# Patient Record
Sex: Female | Born: 1960 | Race: White | Hispanic: No | Marital: Married | Smoking: Never smoker
Health system: Southern US, Community
[De-identification: ages and names within clinical notes are randomized; demographics above are authoritative.]

## PROBLEM LIST (undated history)

## (undated) DIAGNOSIS — T7840XA Allergy, unspecified, initial encounter: Secondary | ICD-10-CM

## (undated) HISTORY — PX: CHALAZION EXCISION: SHX213

## (undated) HISTORY — DX: Allergy, unspecified, initial encounter: T78.40XA

## (undated) HISTORY — PX: WISDOM TOOTH EXTRACTION: SHX21

---

## 1999-02-23 ENCOUNTER — Encounter: Payer: Self-pay | Admitting: Family Medicine

## 1999-02-23 ENCOUNTER — Encounter: Admission: RE | Admit: 1999-02-23 | Discharge: 1999-02-23 | Payer: Self-pay | Admitting: Family Medicine

## 2000-02-27 ENCOUNTER — Encounter: Payer: Self-pay | Admitting: Family Medicine

## 2000-02-27 ENCOUNTER — Encounter: Admission: RE | Admit: 2000-02-27 | Discharge: 2000-02-27 | Payer: Self-pay | Admitting: Family Medicine

## 2001-02-27 ENCOUNTER — Encounter: Admission: RE | Admit: 2001-02-27 | Discharge: 2001-02-27 | Payer: Self-pay | Admitting: Family Medicine

## 2001-02-27 ENCOUNTER — Encounter: Payer: Self-pay | Admitting: Family Medicine

## 2002-03-12 ENCOUNTER — Encounter: Payer: Self-pay | Admitting: Family Medicine

## 2002-03-12 ENCOUNTER — Encounter: Admission: RE | Admit: 2002-03-12 | Discharge: 2002-03-12 | Payer: Self-pay | Admitting: Family Medicine

## 2003-03-31 ENCOUNTER — Encounter: Admission: RE | Admit: 2003-03-31 | Discharge: 2003-03-31 | Payer: Self-pay | Admitting: Family Medicine

## 2004-05-19 ENCOUNTER — Encounter: Admission: RE | Admit: 2004-05-19 | Discharge: 2004-05-19 | Payer: Self-pay | Admitting: Family Medicine

## 2005-05-22 ENCOUNTER — Encounter: Admission: RE | Admit: 2005-05-22 | Discharge: 2005-05-22 | Payer: Self-pay | Admitting: Family Medicine

## 2005-06-01 ENCOUNTER — Encounter: Admission: RE | Admit: 2005-06-01 | Discharge: 2005-06-01 | Payer: Self-pay | Admitting: Family Medicine

## 2006-05-24 ENCOUNTER — Encounter: Admission: RE | Admit: 2006-05-24 | Discharge: 2006-05-24 | Payer: Self-pay | Admitting: Family Medicine

## 2006-12-05 ENCOUNTER — Encounter: Admission: RE | Admit: 2006-12-05 | Discharge: 2006-12-05 | Payer: Self-pay | Admitting: Family Medicine

## 2007-05-26 ENCOUNTER — Encounter: Admission: RE | Admit: 2007-05-26 | Discharge: 2007-05-26 | Payer: Self-pay | Admitting: Family Medicine

## 2008-05-26 ENCOUNTER — Encounter: Admission: RE | Admit: 2008-05-26 | Discharge: 2008-05-26 | Payer: Self-pay | Admitting: Family Medicine

## 2009-05-27 ENCOUNTER — Encounter: Admission: RE | Admit: 2009-05-27 | Discharge: 2009-05-27 | Payer: Self-pay | Admitting: Family Medicine

## 2010-05-29 ENCOUNTER — Encounter
Admission: RE | Admit: 2010-05-29 | Discharge: 2010-05-29 | Payer: Self-pay | Source: Home / Self Care | Attending: Family Medicine | Admitting: Family Medicine

## 2011-05-04 ENCOUNTER — Other Ambulatory Visit: Payer: Self-pay | Admitting: Family Medicine

## 2011-05-04 DIAGNOSIS — Z1231 Encounter for screening mammogram for malignant neoplasm of breast: Secondary | ICD-10-CM

## 2011-06-01 ENCOUNTER — Ambulatory Visit
Admission: RE | Admit: 2011-06-01 | Discharge: 2011-06-01 | Disposition: A | Discharge status: Home / Self Care | Payer: 59 | Source: Ambulatory Visit | Attending: Family Medicine | Admitting: Family Medicine

## 2011-06-01 DIAGNOSIS — Z1231 Encounter for screening mammogram for malignant neoplasm of breast: Secondary | ICD-10-CM

## 2012-05-14 ENCOUNTER — Other Ambulatory Visit: Payer: Self-pay | Admitting: Family Medicine

## 2012-05-14 DIAGNOSIS — Z1231 Encounter for screening mammogram for malignant neoplasm of breast: Secondary | ICD-10-CM

## 2012-06-02 ENCOUNTER — Ambulatory Visit
Admission: RE | Admit: 2012-06-02 | Discharge: 2012-06-02 | Disposition: A | Discharge status: Home / Self Care | Payer: 59 | Source: Ambulatory Visit | Attending: Family Medicine | Admitting: Family Medicine

## 2012-06-02 DIAGNOSIS — Z1231 Encounter for screening mammogram for malignant neoplasm of breast: Secondary | ICD-10-CM

## 2013-03-14 ENCOUNTER — Ambulatory Visit (INDEPENDENT_AMBULATORY_CARE_PROVIDER_SITE_OTHER): Payer: 59 | Admitting: Family Medicine

## 2013-03-14 VITALS — BP 100/62 | HR 84 | Temp 98.1°F | Resp 16 | Ht 67.25 in | Wt 161.4 lb

## 2013-03-14 DIAGNOSIS — J069 Acute upper respiratory infection, unspecified: Secondary | ICD-10-CM

## 2013-03-14 DIAGNOSIS — G43909 Migraine, unspecified, not intractable, without status migrainosus: Secondary | ICD-10-CM

## 2013-03-14 NOTE — Progress Notes (Signed)
Subjective

## 2013-03-14 NOTE — Patient Instructions (Signed)
Continue using over-the-counter medications for symptomatic relief. Return if worse or call back if specific issues.

## 2013-05-07 ENCOUNTER — Other Ambulatory Visit: Payer: Self-pay

## 2013-05-07 DIAGNOSIS — Z1231 Encounter for screening mammogram for malignant neoplasm of breast: Secondary | ICD-10-CM

## 2013-06-08 ENCOUNTER — Ambulatory Visit
Admission: RE | Admit: 2013-06-08 | Discharge: 2013-06-08 | Disposition: A | Discharge status: Home / Self Care | Payer: 59 | Source: Ambulatory Visit

## 2013-06-08 DIAGNOSIS — Z1231 Encounter for screening mammogram for malignant neoplasm of breast: Secondary | ICD-10-CM

## 2014-05-03 ENCOUNTER — Other Ambulatory Visit: Payer: Self-pay

## 2014-05-03 ENCOUNTER — Other Ambulatory Visit: Payer: Self-pay | Admitting: Family Medicine

## 2014-05-03 DIAGNOSIS — Z1231 Encounter for screening mammogram for malignant neoplasm of breast: Secondary | ICD-10-CM

## 2014-06-14 ENCOUNTER — Ambulatory Visit: Payer: 59

## 2014-06-21 ENCOUNTER — Ambulatory Visit
Admission: RE | Admit: 2014-06-21 | Discharge: 2014-06-21 | Disposition: A | Discharge status: Home / Self Care | Payer: BLUE CROSS/BLUE SHIELD | Source: Ambulatory Visit

## 2014-06-21 DIAGNOSIS — Z1231 Encounter for screening mammogram for malignant neoplasm of breast: Secondary | ICD-10-CM

## 2014-06-22 ENCOUNTER — Other Ambulatory Visit: Payer: Self-pay | Admitting: Family Medicine

## 2014-06-22 DIAGNOSIS — R928 Other abnormal and inconclusive findings on diagnostic imaging of breast: Secondary | ICD-10-CM

## 2014-06-29 ENCOUNTER — Ambulatory Visit
Admission: RE | Admit: 2014-06-29 | Discharge: 2014-06-29 | Disposition: A | Discharge status: Home / Self Care | Payer: BLUE CROSS/BLUE SHIELD | Source: Ambulatory Visit | Attending: Family Medicine | Admitting: Family Medicine

## 2014-06-29 DIAGNOSIS — R928 Other abnormal and inconclusive findings on diagnostic imaging of breast: Secondary | ICD-10-CM

## 2015-05-25 ENCOUNTER — Other Ambulatory Visit: Payer: Self-pay

## 2015-05-25 DIAGNOSIS — Z1231 Encounter for screening mammogram for malignant neoplasm of breast: Secondary | ICD-10-CM

## 2015-06-23 ENCOUNTER — Ambulatory Visit
Admission: RE | Admit: 2015-06-23 | Discharge: 2015-06-23 | Disposition: A | Discharge status: Home / Self Care | Payer: Managed Care, Other (non HMO) | Source: Ambulatory Visit

## 2015-06-23 DIAGNOSIS — Z1231 Encounter for screening mammogram for malignant neoplasm of breast: Secondary | ICD-10-CM

## 2015-06-27 ENCOUNTER — Other Ambulatory Visit: Payer: Self-pay | Admitting: Family Medicine

## 2015-06-27 DIAGNOSIS — R928 Other abnormal and inconclusive findings on diagnostic imaging of breast: Secondary | ICD-10-CM

## 2015-07-04 ENCOUNTER — Ambulatory Visit
Admission: RE | Admit: 2015-07-04 | Discharge: 2015-07-04 | Disposition: A | Discharge status: Home / Self Care | Payer: Managed Care, Other (non HMO) | Source: Ambulatory Visit | Attending: Family Medicine | Admitting: Family Medicine

## 2015-07-04 DIAGNOSIS — R928 Other abnormal and inconclusive findings on diagnostic imaging of breast: Secondary | ICD-10-CM

## 2016-03-21 ENCOUNTER — Other Ambulatory Visit: Payer: Self-pay | Admitting: Family Medicine

## 2016-03-21 DIAGNOSIS — Z1231 Encounter for screening mammogram for malignant neoplasm of breast: Secondary | ICD-10-CM

## 2016-06-25 ENCOUNTER — Ambulatory Visit
Admission: RE | Admit: 2016-06-25 | Discharge: 2016-06-25 | Disposition: A | Discharge status: Home / Self Care | Payer: Managed Care, Other (non HMO) | Source: Ambulatory Visit | Attending: Family Medicine | Admitting: Family Medicine

## 2016-06-25 DIAGNOSIS — Z1231 Encounter for screening mammogram for malignant neoplasm of breast: Secondary | ICD-10-CM

## 2017-05-30 ENCOUNTER — Other Ambulatory Visit: Payer: Self-pay | Admitting: Family Medicine

## 2017-05-30 DIAGNOSIS — Z1231 Encounter for screening mammogram for malignant neoplasm of breast: Secondary | ICD-10-CM

## 2017-06-27 ENCOUNTER — Ambulatory Visit
Admission: RE | Admit: 2017-06-27 | Discharge: 2017-06-27 | Disposition: A | Discharge status: Home / Self Care | Payer: Managed Care, Other (non HMO) | Source: Ambulatory Visit | Attending: Family Medicine | Admitting: Family Medicine

## 2017-06-27 DIAGNOSIS — Z1231 Encounter for screening mammogram for malignant neoplasm of breast: Secondary | ICD-10-CM

## 2018-05-27 ENCOUNTER — Other Ambulatory Visit: Payer: Self-pay | Admitting: Family Medicine

## 2018-05-27 DIAGNOSIS — Z1231 Encounter for screening mammogram for malignant neoplasm of breast: Secondary | ICD-10-CM

## 2018-07-01 ENCOUNTER — Ambulatory Visit
Admission: RE | Admit: 2018-07-01 | Discharge: 2018-07-01 | Disposition: A | Discharge status: Home / Self Care | Payer: BLUE CROSS/BLUE SHIELD | Source: Ambulatory Visit | Attending: Family Medicine | Admitting: Family Medicine

## 2018-07-01 DIAGNOSIS — Z1231 Encounter for screening mammogram for malignant neoplasm of breast: Secondary | ICD-10-CM

## 2018-07-02 ENCOUNTER — Other Ambulatory Visit: Payer: Self-pay | Admitting: Family Medicine

## 2018-07-02 DIAGNOSIS — R928 Other abnormal and inconclusive findings on diagnostic imaging of breast: Secondary | ICD-10-CM

## 2018-07-04 ENCOUNTER — Ambulatory Visit
Admission: RE | Admit: 2018-07-04 | Discharge: 2018-07-04 | Disposition: A | Discharge status: Home / Self Care | Payer: BLUE CROSS/BLUE SHIELD | Source: Ambulatory Visit | Attending: Family Medicine | Admitting: Family Medicine

## 2018-07-04 ENCOUNTER — Ambulatory Visit: Payer: BLUE CROSS/BLUE SHIELD

## 2018-07-04 DIAGNOSIS — R928 Other abnormal and inconclusive findings on diagnostic imaging of breast: Secondary | ICD-10-CM

## 2020-08-17 IMAGING — MG DIGITAL DIAGNOSTIC UNILATERAL LEFT MAMMOGRAM WITH TOMO AND CAD
8 series · 9 of 24 positions shown · non-contrast
Comparison: Previous exam(s).

CLINICAL DATA: Patient was called back from screening mammogram for
possible distortion in the left breast.

EXAM:
DIGITAL DIAGNOSTIC UNILATERAL LEFT MAMMOGRAM WITH CAD AND TOMO

[L CC synth-2D (1 of 2)]
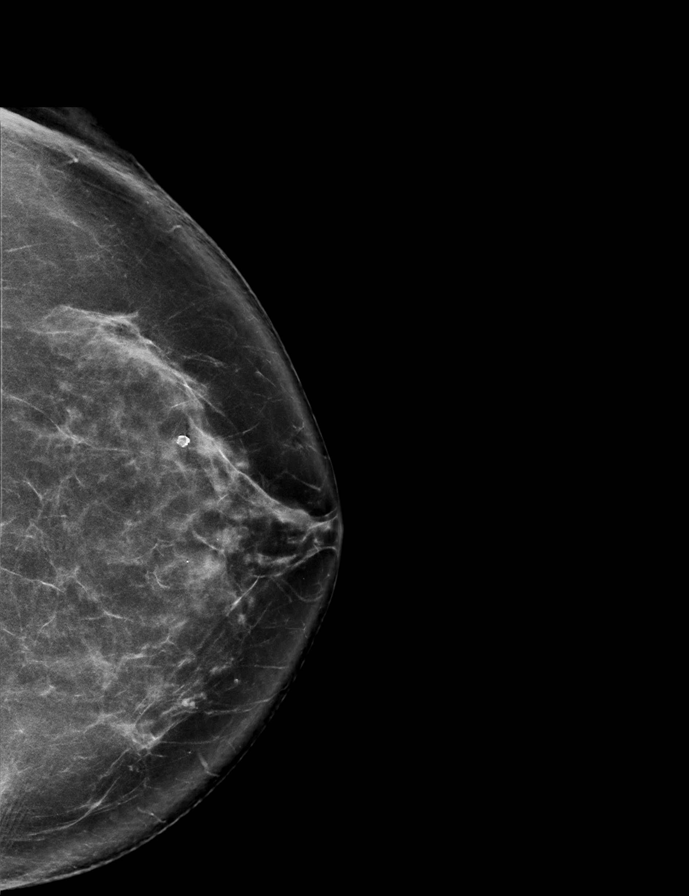

[L MLO synth-2D]
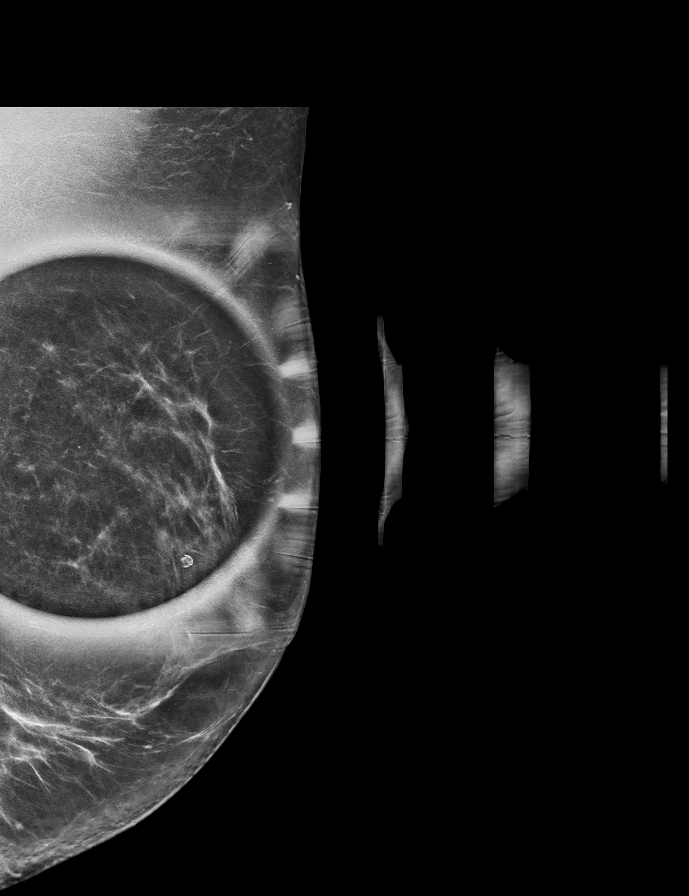

[L CC synth-2D (2 of 2)]
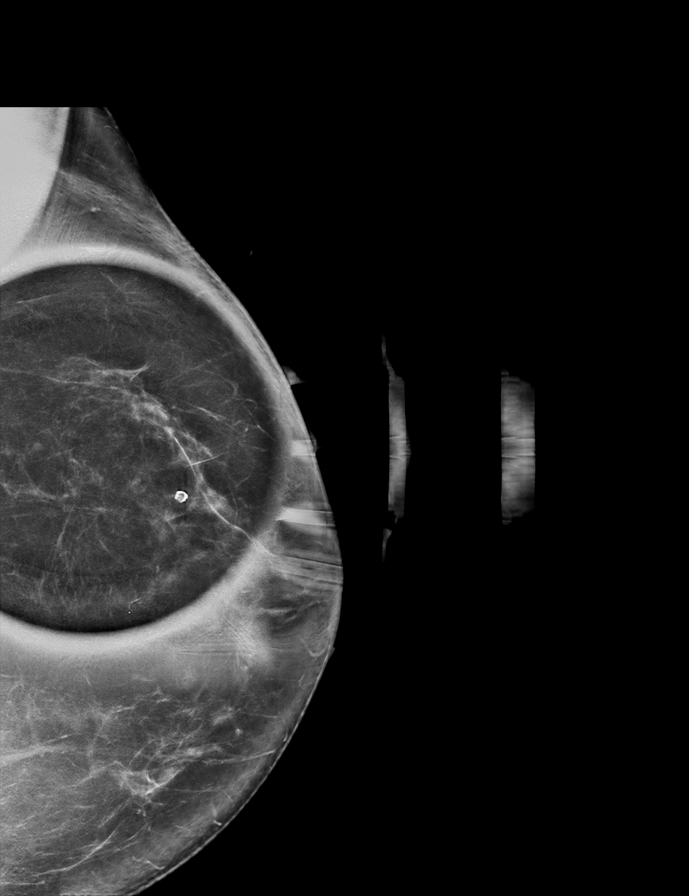

[L ML synth-2D]
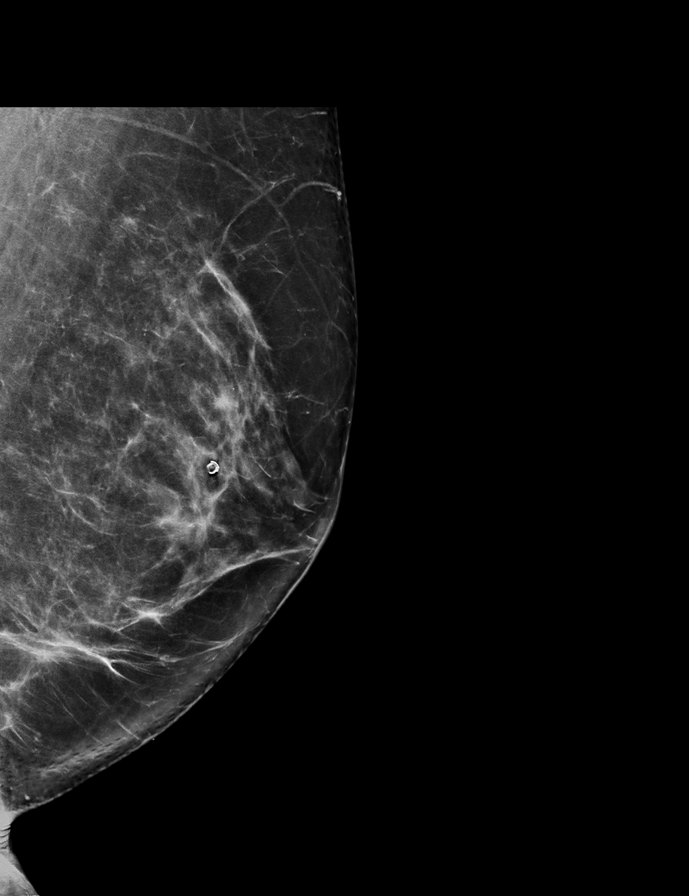

[L ML tomo · 2 of 79 frames shown]
[frame 26/79]
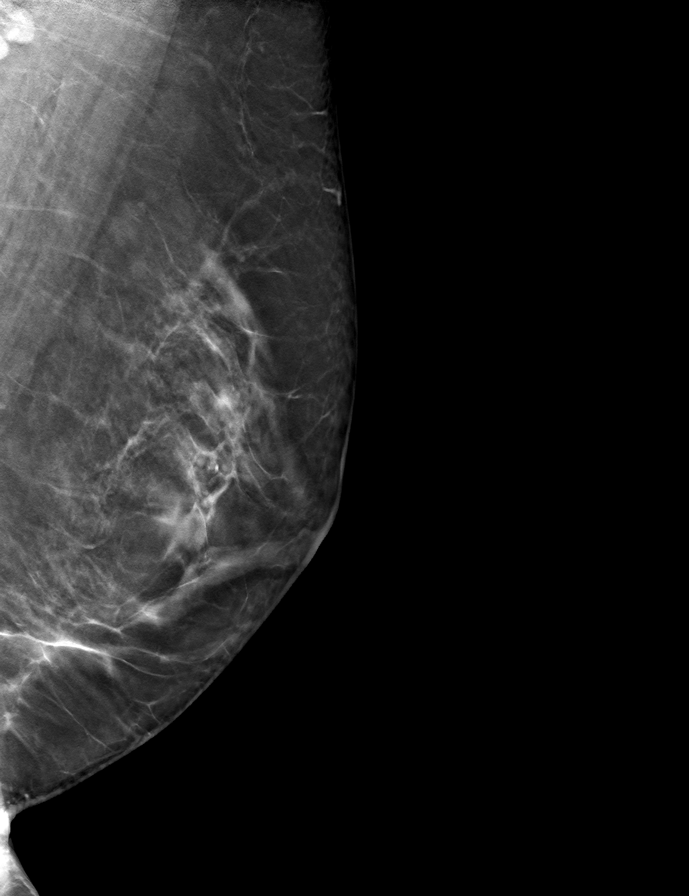
[frame 40/79]
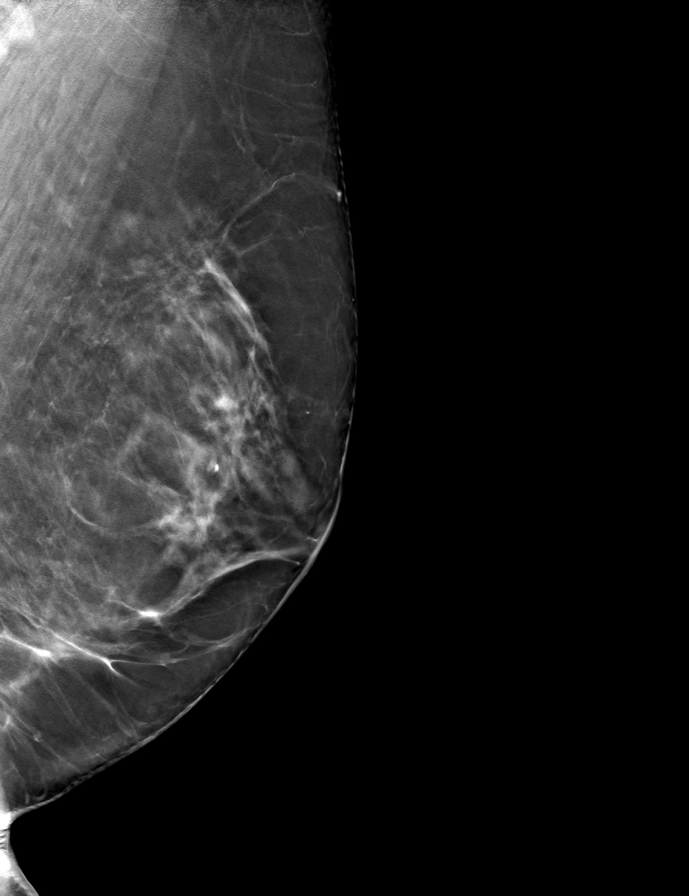

[L CC tomo (1 of 2) · tomo slice 47/92.0]
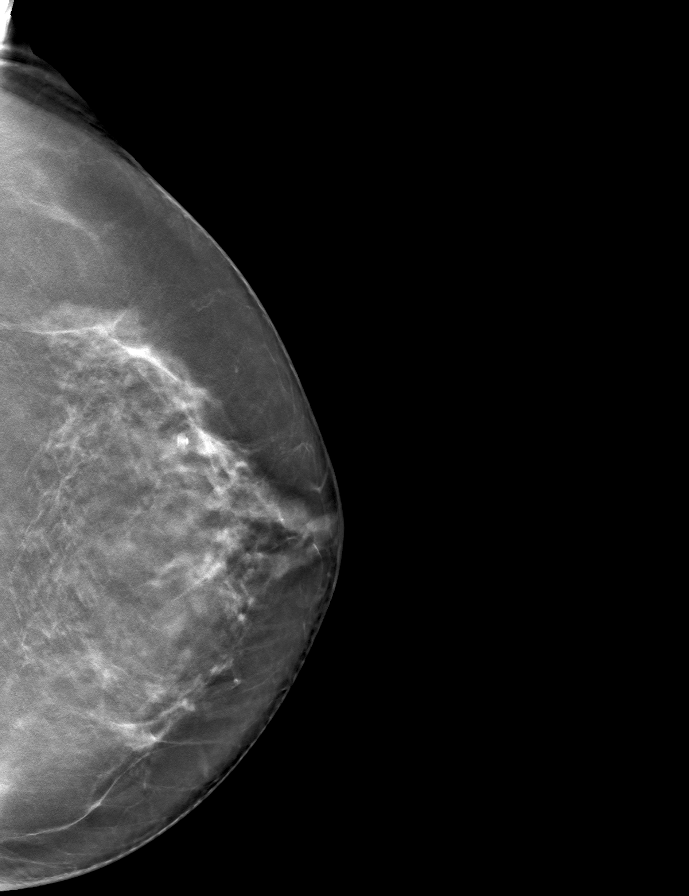

[L CC tomo (2 of 2) · tomo slice 45/89.0]
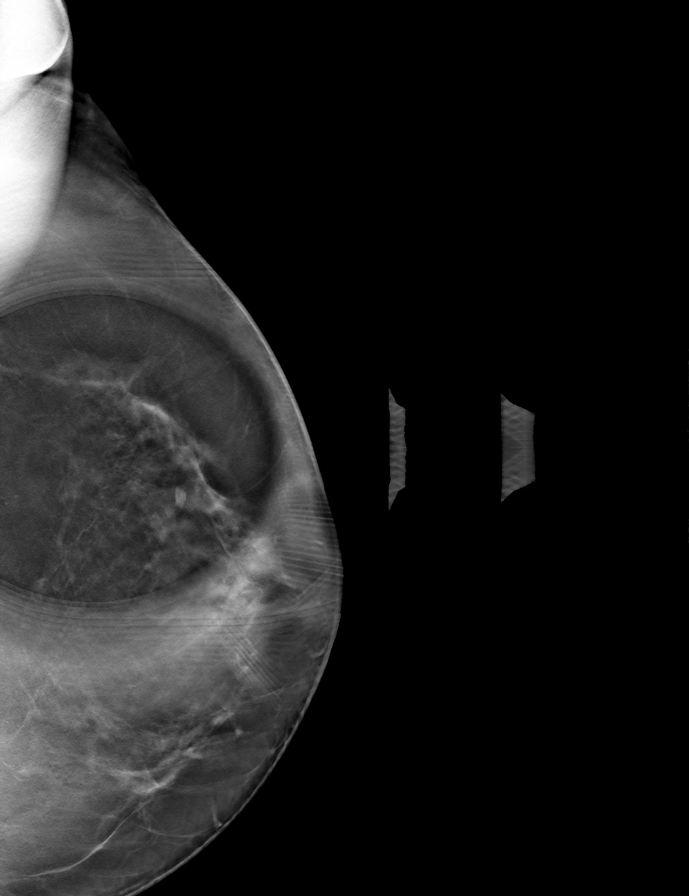

[L MLO tomo · tomo slice 39/77.0]
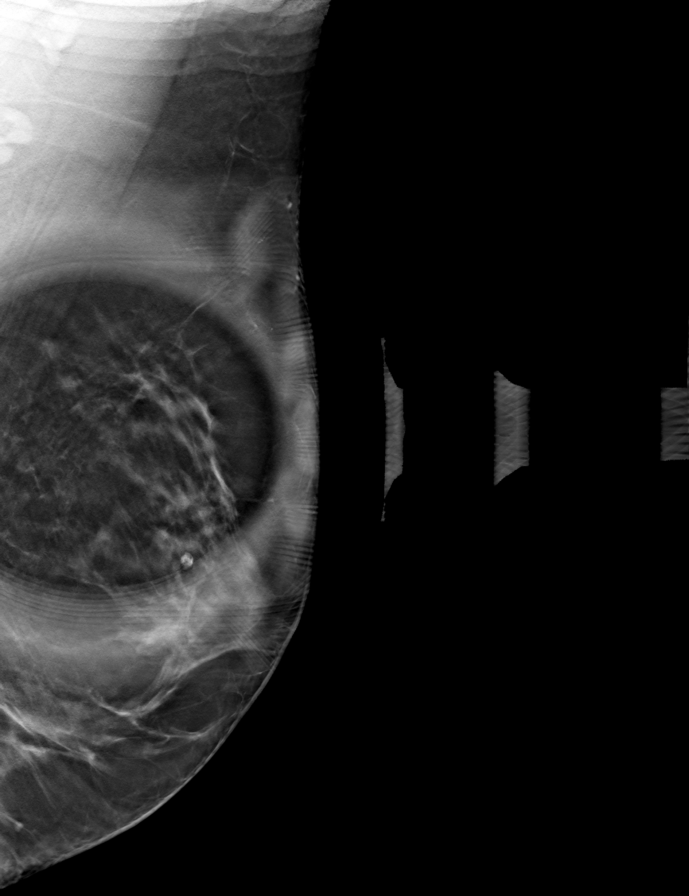

[9 of 24 positions shown; findings below may reference images not displayed]

ACR Breast Density Category b: There are scattered areas of
fibroglandular density.
FINDINGS: Additional imaging of the left breast was performed. No distortion,
mass or malignant type microcalcifications identified.

Mammographic images were processed with CAD.
IMPRESSION: No evidence of malignancy in the left breast.

RECOMMENDATION:
Bilateral screening mammogram in 1 year is recommended.

I have discussed the findings and recommendations with the patient.
Results were also provided in writing at the conclusion of the
visit. If applicable, a reminder letter will be sent to the patient
regarding the next appointment.

BI-RADS CATEGORY  1: Negative.

## 2021-01-03 ENCOUNTER — Ambulatory Visit: Payer: Self-pay | Admitting: Allergy

## 2021-01-12 ENCOUNTER — Ambulatory Visit: Payer: BC Managed Care – PPO | Admitting: Allergy and Immunology

## 2021-01-12 ENCOUNTER — Encounter: Payer: Self-pay | Admitting: Allergy and Immunology

## 2021-01-12 ENCOUNTER — Other Ambulatory Visit: Payer: Self-pay

## 2021-01-12 VITALS — BP 118/76 | HR 74 | Resp 16 | Ht 67.0 in | Wt 186.8 lb

## 2021-01-12 DIAGNOSIS — L255 Unspecified contact dermatitis due to plants, except food: Secondary | ICD-10-CM

## 2021-01-12 DIAGNOSIS — J3089 Other allergic rhinitis: Secondary | ICD-10-CM

## 2021-01-12 MED ORDER — HALOBETASOL PROPIONATE 0.05 % EX OINT: TOPICAL_OINTMENT | Freq: Two times a day (BID) | CUTANEOUS | 5 refills | Status: DC

## 2021-01-12 MED ORDER — MONTELUKAST SODIUM 10 MG PO TABS: 10.0000 mg | ORAL_TABLET | Freq: Every day | ORAL | 5 refills | Status: DC

## 2021-01-12 MED ORDER — DESONIDE 0.05 % EX OINT: TOPICAL_OINTMENT | CUTANEOUS | 5 refills | Status: DC

## 2021-01-12 NOTE — Patient Instructions (Addendum)
  1.  Allergen avoidance measures?  2.  Treat and prevent inflammation of airway:  A. Montelukast 10 mg - 1 tablet 1 time per day B. OTC Flonase Sensimist - 1-2 sprays each nostril 3-7 times per week  3. If needed:  A. Ultravate ointment to bug bite and plant dermatitis 2 times a day B. Desonide ointment to eye lids 3-7 times per week C. Zyrtec 10 mg - 1 tablet 1 time per day D. Pataday - 1 drop each eye 1 time per day  4. Obtain fall flu vaccine  5. Return to clinic in 6 months or earlier if problem

## 2021-01-12 NOTE — Progress Notes (Signed)
[place] - [place] - [place] - [place] - [place]   NEW PATIENT NOTE  Referring Provider: No ref. provider found Primary Provider: Olive Bass, MD Date of office visit: 01/12/2021    Subjective:   Chief Complaint:  Erica Crawford (DOB: [birth year]) is a 60 y.o. female who presents to the clinic on 01/12/2021 with a chief complaint of Allergic Rhinitis  and Rash .     HPI: Erica Crawford presents to this clinic in evaluation of several issues.  First, she has "lifelong seasonal allergies".  This appears to be more of an issue in the fall than the spring and involves her nose and eyes.  She will have some nasal congestion and sneezing and itchy watery eyes.  Occasionally she develops a "stye" of her eye.  Second, she has had some dermatitis.  This is involved her torso and her arms and she was diagnosed with poison ivy and given Kenalog and a topical agent to use.  She has had a similar type of rash in the past and was always told that she had poison ivy.  When she takes oral steroids it sometimes develops and hand swelling.  In addition, she sometimes gets very large local reactions to mosquito bites and other insect stings.  Third, she gets eyelid flaking especially in the spring more so than the fall.  Fourth, since she is discontinue her Zyrtec she has itchy.  She now finds it when she does not take Zyrtec she gets itchy.  She has no associated systemic or constitutional symptoms.  She uses a CPAP for sleep apnea.  She does not receive a COVID-vaccine.  She has not been infected with COVID as far she knows.  Past Medical History:  Diagnosis Date   Allergy     Past Surgical History:  Procedure Laterality Date   CHALAZION EXCISION     WISDOM TOOTH EXTRACTION      Allergies as of 01/12/2021       Reactions   Prednisone Swelling   Penicillins Swelling   Sulfa Antibiotics Nausea Only        Medication List    ascorbic acid 500 MG tablet Commonly known as:  VITAMIN C Vitamin C 500 mg tablet   1 tablet every day by oral route.   aspirin 81 MG tablet Take 81 mg by mouth daily.   aspirin 81 MG EC tablet Take by mouth.   bifidobacterium infantis capsule Take 1 capsule by mouth daily.   calcium carbonate 1250 (500 Ca) MG tablet Commonly known as: OS-CAL - dosed in mg of elemental calcium Calcium 500  500 mg calcium (1,250 mg) tablet   1 tablet every day by oral route.   cetirizine 10 MG tablet Commonly known as: ZYRTEC Zyrtec 10 mg tablet   1 tablet every day by oral route.   Cholecalciferol 25 MCG (1000 UT) tablet Vitamin D3 25 mcg (1,000 unit) tablet   2 tablets every day by oral route.   desonide 0.05 % ointment Commonly known as: DESOWEN Apply to eye lids 3-7 times per week as needed Started by: Kaylor Maiers Claudia Pollock, MD   Echinacea 400 MG Caps   Fish Oil 1000 MG Caps   guaifenesin 400 MG Tabs tablet Commonly known as: HUMIBID E guaifenesin 400 mg tablet   1 tablet every day by oral route.   halobetasol 0.05 % ointment Commonly known as: Ultravate Apply topically 2 (two) times daily. Started by: Jessica Priest, MD   montelukast 10 MG  tablet Commonly known as: SINGULAIR Take 1 tablet (10 mg total) by mouth at bedtime. Started by: Jessica Priest, MD   STOOL SOFTENER PO Take by mouth.   triamcinolone cream 0.1 % Commonly known as: KENALOG Apply topically 3 (three) times daily as needed.   Womens Multivitamin Tabs    Review of systems negative except as noted in HPI / PMHx or noted below:  Review of Systems  Constitutional: Negative.   HENT: Negative.    Eyes: Negative.   Respiratory: Negative.    Cardiovascular: Negative.   Gastrointestinal: Negative.   Genitourinary: Negative.   Musculoskeletal: Negative.   Skin: Negative.   Neurological: Negative.   Endo/Heme/Allergies: Negative.   Psychiatric/Behavioral: Negative.     Family History  Problem Relation Age of Onset   Cancer Mother    Breast cancer  Mother    Cancer Father    Asthma Brother    Allergic rhinitis Brother    Hypertension Maternal Grandmother    Stroke Maternal Grandmother    Cancer Maternal Grandfather    Heart disease Paternal Grandmother    Hypertension Paternal Grandfather     Social History   Socioeconomic History   Marital status: Married    Spouse name: Not on file   Number of children: Not on file   Years of education: Not on file   Highest education level: Not on file  Occupational History   Not on file  Tobacco Use   Smoking status: Never   Smokeless tobacco: Never  Substance and Sexual Activity   Alcohol use: Yes   Drug use: No   Sexual activity: Not on file  Other Topics Concern   Not on file  Social History Narrative   Not on file   Environmental and Social history  Lives in a house with a dry environment, no animals located inside the household, carpet in the bedroom, no plastic on the bed, no plastic on the pillow, no smoking ongoing with inside the household.  Objective:   Vitals:   01/12/21 1507  BP: 118/76  Pulse: 74  Resp: 16  SpO2: 98%   Height: 5\' 7"  (170.2 cm) Weight: 186 lb 12.8 oz (84.7 kg)  Physical Exam Constitutional:      Appearance: She is not diaphoretic.  HENT:     Head: Normocephalic.     Right Ear: Tympanic membrane, ear canal and external ear normal.     Left Ear: Tympanic membrane, ear canal and external ear normal.     Nose: Nose normal. No mucosal edema or rhinorrhea.     Mouth/Throat:     Pharynx: Uvula midline. No oropharyngeal exudate.  Eyes:     Conjunctiva/sclera: Conjunctivae normal.  Neck:     Thyroid: No thyromegaly.     Trachea: Trachea normal. No tracheal tenderness or tracheal deviation.  Cardiovascular:     Rate and Rhythm: Normal rate and regular rhythm.     Heart sounds: Normal heart sounds, S1 normal and S2 normal. No murmur heard. Pulmonary:     Effort: No respiratory distress.     Breath sounds: Normal breath sounds. No  stridor. No wheezing or rales.  Lymphadenopathy:     Head:     Right side of head: No tonsillar adenopathy.     Left side of head: No tonsillar adenopathy.     Cervical: No cervical adenopathy.  Skin:    Findings: No erythema or rash.     Nails: There is no clubbing.  Neurological:  Mental Status: She is alert.    Diagnostics: Allergy skin tests were performed.  She did not demonstrate any hypersensitivity against a screening panel of aeroallergens or foods.  Assessment and Plan:    1. Perennial allergic rhinitis   2. Contact dermatitis due to plant     1.  Allergen avoidance measures?  2.  Treat and prevent inflammation of airway:  A. Montelukast 10 mg - 1 tablet 1 time per day B. OTC Flonase Sensimist - 1-2 sprays each nostril 3-7 times per week  3. If needed:  A. Ultravate ointment to bug bite and plant dermatitis 2 times a day B. Desonide ointment to eye lids 3-7 times per week C. Zyrtec 10 mg - 1 tablet 1 time per day D. Pataday - 1 drop each eye 1 time per day  4. Obtain fall flu vaccine  5. Return to clinic in 6 months or earlier if problem  Harshitha appears to have some inflammation of her airway and we will have her use montelukast and a nasal steroid on a preventative basis aiming for the least amount of this medication that is required to control and prevent her symptoms.  She also appears to have some problems with inflammation of her skin and what sounds like plant induced contact dermatitis from the Rhus family.  She can use some occasional low potency topical steroid to her eyelids if required and she can apply high potency topical steroid when she develops a contact dermatitis.  Assuming she does well with this plan I will see her back in this clinic in 6 months or earlier if there is a problem.  Jessica Priest, MD Allergy / Immunology [facility]

## 2021-01-16 ENCOUNTER — Encounter: Payer: Self-pay | Admitting: Allergy and Immunology

## 2021-07-11 ENCOUNTER — Other Ambulatory Visit: Payer: Self-pay | Admitting: Allergy and Immunology

## 2021-07-12 ENCOUNTER — Encounter: Payer: Self-pay | Admitting: Allergy and Immunology

## 2021-07-12 ENCOUNTER — Ambulatory Visit: Payer: 59 | Admitting: Allergy and Immunology

## 2021-07-12 ENCOUNTER — Other Ambulatory Visit: Payer: Self-pay

## 2021-07-12 VITALS — BP 118/62 | HR 80 | Resp 16 | Ht 67.0 in | Wt 186.0 lb

## 2021-07-12 DIAGNOSIS — J988 Other specified respiratory disorders: Secondary | ICD-10-CM | POA: Diagnosis not present

## 2021-07-12 DIAGNOSIS — L255 Unspecified contact dermatitis due to plants, except food: Secondary | ICD-10-CM | POA: Diagnosis not present

## 2021-07-12 DIAGNOSIS — J3089 Other allergic rhinitis: Secondary | ICD-10-CM

## 2021-07-12 DIAGNOSIS — B9789 Other viral agents as the cause of diseases classified elsewhere: Secondary | ICD-10-CM

## 2021-07-12 NOTE — Progress Notes (Signed)
? ?[place] - [employer] - [place] - [place] - [place] ? ? ?Follow-up Note ? ?Referring Provider: Olive Bass, MD ?Primary Provider: Olive Bass, MD ?Date of Office Visit: 07/12/2021 ? ?Subjective:  ? ?Erica Crawford (DOB: 03-Jul-1960) is a 61 y.o. female who [facility] in re-evaluation of the following: ? ?HPI: Erica Crawford presents to this clinic in evaluation of allergic rhinitis, inflammatory dermatosis, contact dermatitis.  I last saw her in this clinic during her initial evaluation of 12 January 2021. ? ?She has really done well with her airway issues and she has not had any problems with dermatitis and overall was very happy with how she was doing while using montelukast and the nasal steroid on a pretty consistent basis. ? ?Unfortunately, about 5 days ago she had a film in her throat and a sore throat and a tickle in her throat and little bit of a cough without any significant nasal symptoms and without a fever or other respiratory tract symptoms.  She is not achy and has had no chills.  She has had a little bit of a sore throat.  This started while she was on her sixth day of a 1 week vacation in [place]. ? ?Allergies as of 07/12/2021   ? ?   Reactions  ? Penicillins Swelling  ? Sulfa Antibiotics Nausea Only  ? ?  ? ?  ?Medication List  ? ? ?ascorbic acid 500 MG tablet ?Commonly known as: VITAMIN C ?Vitamin C 500 mg tablet ?  1 tablet every day by oral route. ?  ?aspirin 81 MG EC tablet ?Take by mouth. ?  ?bifidobacterium infantis capsule ?Take 1 capsule by mouth daily. ?  ?calcium carbonate 1250 (500 Ca) MG tablet ?Commonly known as: OS-CAL - dosed in mg of elemental calcium ?Calcium 500  500 mg calcium (1,250 mg) tablet ?  1 tablet every day by oral route. ?  ?Cholecalciferol 25 MCG (1000 UT) tablet ?Vitamin D3 25 mcg (1,000 unit) tablet ?  2 tablets every day by oral route. ?  ?desonide 0.05 % ointment ?Commonly known as: DESOWEN ?Apply to eye lids  3-7 times per week as needed ?  ?Echinacea 400 MG Caps ?  ?Fish Oil 1000 MG Caps ?  ?guaifenesin 400 MG Tabs tablet ?Commonly known as: HUMIBID E ?guaifenesin 400 mg tablet ?  1 tablet every day by oral route. ?  ?halobetasol 0.05 % ointment ?Commonly known as: Ultravate ?Apply topically 2 (two) times daily. ?  ?montelukast 10 MG tablet ?Commonly known as: SINGULAIR ?TAKE 1 TABLET BY MOUTH EVERYDAY AT BEDTIME ?  ?STOOL SOFTENER PO ?Take by mouth. ?  ?Womens Multivitamin Tabs ?  ? ?Past Medical History:  ?Diagnosis Date  ? Allergy   ? ? ?Past Surgical History:  ?Procedure Laterality Date  ? CHALAZION EXCISION    ? WISDOM TOOTH EXTRACTION    ? ? ?Review of systems negative except as noted in HPI / PMHx or noted below: ? ?Review of Systems  ?Constitutional: Negative.   ?HENT: Negative.    ?Eyes: Negative.   ?Respiratory: Negative.    ?Cardiovascular: Negative.   ?Gastrointestinal: Negative.   ?Genitourinary: Negative.   ?Musculoskeletal: Negative.   ?Skin: Negative.   ?Neurological: Negative.   ?Endo/Heme/Allergies: Negative.   ?Psychiatric/Behavioral: Negative.    ? ? ?Objective:  ? ?Vitals:  ? 07/12/21 1532  ?BP: 118/62  ?Pulse: 80  ?Resp: 16  ?SpO2: 98%  ? ?Height: 5\' 7"  (170.2 cm)  ?  Weight: 186 lb (84.4 kg)  ? ?Physical Exam ?Constitutional:   ?   Appearance: She is not diaphoretic.  ?HENT:  ?   Head: Normocephalic.  ?   Right Ear: Tympanic membrane, ear canal and external ear normal.  ?   Left Ear: Tympanic membrane, ear canal and external ear normal.  ?   Nose: Nose normal. No mucosal edema or rhinorrhea.  ?   Mouth/Throat:  ?   Pharynx: Uvula midline. No oropharyngeal exudate.  ?Eyes:  ?   Conjunctiva/sclera: Conjunctivae normal.  ?Neck:  ?   Thyroid: No thyromegaly.  ?   Trachea: Trachea normal. No tracheal tenderness or tracheal deviation.  ?Cardiovascular:  ?   Rate and Rhythm: Normal rate and regular rhythm.  ?   Heart sounds: Normal heart sounds, S1 normal and S2 normal. No murmur heard. ?Pulmonary:  ?    Effort: No respiratory distress.  ?   Breath sounds: Normal breath sounds. No stridor. No wheezing or rales.  ?Lymphadenopathy:  ?   Head:  ?   Right side of head: No tonsillar adenopathy.  ?   Left side of head: No tonsillar adenopathy.  ?   Cervical: No cervical adenopathy.  ?Skin: ?   Findings: No erythema or rash.  ?   Nails: There is no clubbing.  ?Neurological:  ?   Mental Status: She is alert.  ? ? ?Diagnostics: none ? ?Assessment and Plan:  ? ?1. Perennial allergic rhinitis   ?2. Contact dermatitis due to plant   ?3. Viral respiratory illness   ? ? ?1.  Treat and prevent inflammation of airway: ? ?A. Montelukast 10 mg - 1 tablet 1 time per day ?B. OTC Flonase Sensimist - 1-2 sprays each nostril 1 time per day ? ?2. If needed: ? ?A. Ultravate ointment to bug bite and plant dermatitis 2 times a day ?B. Desonide ointment to eye lids 3-7 times per week ?C. Zyrtec 10 mg - 1 tablet 1 time per day ?D. Pataday - 1 drop each eye 1 time per day ? ?3. For this recent event: ? ?A. Prednisone 10 mg - 1 tablet 1 time per day for 7 days only ?B. Mucinex DM ? ?4. Return to clinic in 12 months or earlier if problem ? ?[name]'s history is very consistent with developing a relatively mild respiratory tract infection most likely of viral etiology and we will treat her with the therapy noted above and assume she is going to do well with this plan.  Her montelukast and Flonase has resulted in excellent control of her airway issue in general and she has not had to utilize any therapy for her dermatitis since her last visit.  Assuming she does well with this plan I will see her back in this clinic in 1 year or earlier if there is a problem. ? ?[name], MD ?Allergy / Immunology ?[facility] ?

## 2021-07-12 NOTE — Patient Instructions (Addendum)
?  1.  Treat and prevent inflammation of airway: ? ?A. Montelukast 10 mg - 1 tablet 1 time per day ?B. OTC Flonase Sensimist - 1-2 sprays each nostril 1 time per day ? ?2. If needed: ? ?A. Ultravate ointment to bug bite and plant dermatitis 2 times a day ?B. Desonide ointment to eye lids 3-7 times per week ?C. Zyrtec 10 mg - 1 tablet 1 time per day ?D. Pataday - 1 drop each eye 1 time per day ? ?3. For this recent event: ? ?A. Prednisone 10 mg - 1 tablet 1 time per day for 7 days only ?B. Mucinex DM ? ?4. Return to clinic in 12 months or earlier if problem ?

## 2021-07-13 ENCOUNTER — Encounter: Payer: Self-pay | Admitting: Allergy and Immunology

## 2022-04-04 ENCOUNTER — Other Ambulatory Visit: Payer: Self-pay | Admitting: Allergy and Immunology

## 2022-07-16 ENCOUNTER — Ambulatory Visit: Payer: 59 | Admitting: Allergy and Immunology

## 2022-07-16 ENCOUNTER — Encounter: Payer: Self-pay | Admitting: Allergy and Immunology

## 2022-07-16 VITALS — BP 92/66 | HR 68 | Resp 14 | Ht 66.0 in | Wt 192.8 lb

## 2022-07-16 DIAGNOSIS — J3089 Other allergic rhinitis: Secondary | ICD-10-CM | POA: Diagnosis not present

## 2022-07-16 DIAGNOSIS — L989 Disorder of the skin and subcutaneous tissue, unspecified: Secondary | ICD-10-CM | POA: Diagnosis not present

## 2022-07-16 MED ORDER — MONTELUKAST SODIUM 10 MG PO TABS: ORAL_TABLET | ORAL | 11 refills | Status: DC

## 2022-07-16 NOTE — Progress Notes (Unsigned)
[place] - [place] - [place]   Follow-up Note  Referring Provider: Algis Greenhouse, MD Primary Provider: Algis Greenhouse, MD Date of Office Visit: 07/16/2022  Subjective:   Erica Crawford (DOB: 1960-10-06) is a 62 y.o. female who [facility] to [facility] on 07/16/2022 in re-evaluation of the following:  HPI: Erica Crawford returns to this clinic in evaluation of allergic rhinitis and intermittent inflammatory dermatosis/contact dermatitis.  I last saw her in this clinic 12 July 2021.  She has had very little problems with her upper airway while intermittently using montelukast and the nasal steroid.  It does not sound as though she is required a systemic steroid or antibiotic for an airway issue.  She still continues to use Ultravate for mosquito bites and insect bites which works very well.  She has had no need to use any other type of ointment in any other location except for insect sting sites.  Allergies as of 07/16/2022       Reactions   Penicillins Swelling   Sulfa Antibiotics Nausea Only        Medication List    ascorbic acid 500 MG tablet Commonly known as: VITAMIN C Vitamin C 500 mg tablet   1 tablet every day by oral route.   aspirin EC 81 MG tablet Take by mouth.   bifidobacterium infantis capsule Take 1 capsule by mouth daily.   calcium carbonate 1250 (500 Ca) MG tablet Commonly known as: OS-CAL - dosed in mg of elemental calcium Calcium 500  500 mg calcium (1,250 mg) tablet   1 tablet every day by oral route.   cetirizine 10 MG tablet Commonly known as: ZYRTEC Take 10 mg by mouth daily as needed.   Cholecalciferol 25 MCG (1000 UT) tablet Vitamin D3 25 mcg (1,000 unit) tablet   2 tablets every day by oral route.   desonide 0.05 % ointment Commonly known as: DESOWEN Apply to eye lids 3-7 times per week as needed   Echinacea 400 MG Caps   Fish Oil 1000 MG Caps   Flonase Sensimist 27.5 MCG/SPRAY nasal  spray Generic drug: fluticasone Place 2 sprays into the nose daily.   guaifenesin 400 MG Tabs tablet Commonly known as: HUMIBID E guaifenesin 400 mg tablet   1 tablet every day by oral route.   halobetasol 0.05 % ointment Commonly known as: Ultravate Apply topically 2 (two) times daily.   montelukast 10 MG tablet Commonly known as: SINGULAIR TAKE 1 TABLET BY MOUTH EVERYDAY AT BEDTIME   PATADAY OP Apply to eye as needed.   STOOL SOFTENER PO Take by mouth.   Womens Multivitamin Tabs    Past Medical History:  Diagnosis Date   Allergy     Past Surgical History:  Procedure Laterality Date   CHALAZION EXCISION     WISDOM TOOTH EXTRACTION      Review of systems negative except as noted in HPI / PMHx or noted below:  Review of Systems  Constitutional: Negative.   HENT: Negative.    Eyes: Negative.   Respiratory: Negative.    Cardiovascular: Negative.   Gastrointestinal: Negative.   Genitourinary: Negative.   Musculoskeletal: Negative.   Skin: Negative.   Neurological: Negative.   Endo/Heme/Allergies: Negative.   Psychiatric/Behavioral: Negative.       Objective:   Vitals:   07/16/22 1118  BP: 92/66  Pulse: 68  Resp: 14  SpO2: 98%   Height: 5\' 6"  (167.6 cm)  Weight: 192  lb 12.8 oz (87.5 kg)   Physical Exam Constitutional:      Appearance: She is not diaphoretic.  HENT:     Head: Normocephalic.     Right Ear: Tympanic membrane, ear canal and external ear normal.     Left Ear: Tympanic membrane, ear canal and external ear normal.     Nose: Nose normal. No mucosal edema or rhinorrhea.     Mouth/Throat:     Pharynx: Uvula midline. No oropharyngeal exudate.  Eyes:     Conjunctiva/sclera: Conjunctivae normal.  Neck:     Thyroid: No thyromegaly.     Trachea: Trachea normal. No tracheal tenderness or tracheal deviation.  Cardiovascular:     Rate and Rhythm: Normal rate and regular rhythm.     Heart sounds: Normal heart sounds, S1 normal and S2  normal. No murmur heard. Pulmonary:     Effort: No respiratory distress.     Breath sounds: Normal breath sounds. No stridor. No wheezing or rales.  Lymphadenopathy:     Head:     Right side of head: No tonsillar adenopathy.     Left side of head: No tonsillar adenopathy.     Cervical: No cervical adenopathy.  Skin:    Findings: No erythema or rash.     Nails: There is no clubbing.  Neurological:     Mental Status: She is alert.     Diagnostics: none  Assessment and Plan:   1. Perennial allergic rhinitis   2. Inflammatory dermatosis    1.  Treat and prevent inflammation of airway:  A. Montelukast 10 mg - 1 tablet 1 time per day B. OTC Flonase Sensimist - 1-2 sprays each nostril 1 time per day  2. If needed:  A. Ultravate ointment to bug bite and plant dermatitis 2 times a day B. Zyrtec 10 mg - 1 tablet 1 time per day C. Pataday - 1 drop each eye 1 time per day  3. Return to clinic in 12 months or earlier if problem  Angelie appears to be doing very well on her current plan.  She has a very good understanding of her disease state and how to use her medications appropriately and appropriate dosing of her medications depending on disease activity and I have refilled all of her medicines and she can return to this clinic in 1 year or earlier if there is a problem.  Allena Katz, MD Allergy / Immunology [place]

## 2022-07-16 NOTE — Patient Instructions (Signed)
  1.  Treat and prevent inflammation of airway:  A. Montelukast 10 mg - 1 tablet 1 time per day B. OTC Flonase Sensimist - 1-2 sprays each nostril 1 time per day  2. If needed:  A. Ultravate ointment to bug bite and plant dermatitis 2 times a day B. Zyrtec 10 mg - 1 tablet 1 time per day C. Pataday - 1 drop each eye 1 time per day  3. Return to clinic in 12 months or earlier if problem

## 2022-07-17 ENCOUNTER — Encounter: Payer: Self-pay | Admitting: Allergy and Immunology

## 2023-07-17 ENCOUNTER — Ambulatory Visit: Payer: 59 | Admitting: Allergy and Immunology

## 2023-07-17 VITALS — BP 120/78 | HR 76 | Temp 98.1°F | Resp 16 | Ht 67.0 in | Wt 194.0 lb

## 2023-07-17 DIAGNOSIS — J3089 Other allergic rhinitis: Secondary | ICD-10-CM

## 2023-07-17 DIAGNOSIS — L989 Disorder of the skin and subcutaneous tissue, unspecified: Secondary | ICD-10-CM | POA: Diagnosis not present

## 2023-07-17 MED ORDER — HALOBETASOL PROPIONATE 0.05 % EX OINT: TOPICAL_OINTMENT | Freq: Two times a day (BID) | CUTANEOUS | 5 refills | Status: AC | PRN

## 2023-07-17 MED ORDER — PIMECROLIMUS 1 % EX CREA: TOPICAL_CREAM | CUTANEOUS | 5 refills | Status: AC

## 2023-07-17 MED ORDER — MONTELUKAST SODIUM 10 MG PO TABS: ORAL_TABLET | ORAL | 11 refills | Status: AC

## 2023-07-17 MED ORDER — DESONIDE 0.05 % EX OINT: TOPICAL_OINTMENT | CUTANEOUS | 5 refills | Status: AC

## 2023-07-17 NOTE — Progress Notes (Unsigned)
 [place] [facility] [place] [facility] [place] - [place] - [place]   Follow-up Note  Referring Provider: Olive Bass, MD Primary Provider: Olive Bass, MD Date of Office Visit: 07/17/2023  Subjective:   Erica Crawford (DOB: October 15, 1960) is a 63 y.o. female who [facility] in re-evaluation of the following:  HPI: Erica Crawford returns to this clinic in evaluation of allergic rhinitis and intermittent inflammatory dermatosis/contact dermatitis.  I last saw her in this clinic 16 July 2022.  Overall she has really done very well with her allergic rhinitis while using montelukast and occasionally some nasal steroids and she has not required a systemic steroid or antibiotic for any type of airway issue.  She continues to use Ultravate ointment to her bug bites which seems to be working quite well.  Apparently she did get stung on her left posterior calf by some type of flying insect and developed a blister November 2024 for which she was treated with incision and drainage, topical triamcinolone, furosemide successfully.  When I initially saw her in this clinic in 2022 she had some periorbital dermatitis and she used some desonide successfully for that issue.  This has redeveloped over the course of the past 2 to 3 months.  She did try a 2-week application of daily desonide which did not help that issue at this point.  Initially was her right eye and now is her left eye.  Allergies as of 07/17/2023       Reactions   Penicillins Swelling   Sulfa Antibiotics Nausea Only        Medication List    ascorbic acid 500 MG tablet Commonly known as: VITAMIN C Vitamin C 500 mg tablet   1 tablet every day by oral route.   aspirin EC 81 MG tablet Take by mouth.   bifidobacterium infantis capsule Take 1 capsule by mouth daily.   calcium carbonate 1250 (500 Ca) MG tablet Commonly known as: OS-CAL - dosed in mg of elemental calcium Calcium 500  500 mg  calcium (1,250 mg) tablet   1 tablet every day by oral route.   Cholecalciferol 25 MCG (1000 UT) tablet Vitamin D3 25 mcg (1,000 unit) tablet   2 tablets every day by oral route.   desonide 0.05 % ointment Commonly known as: DESOWEN Apply to eye lids 3-7 times per week as needed   Echinacea 400 MG Caps   Fish Oil 1000 MG Caps   Flonase Sensimist 27.5 MCG/SPRAY nasal spray Generic drug: fluticasone Place 2 sprays into the nose daily.   guaifenesin 400 MG Tabs tablet Commonly known as: HUMIBID E guaifenesin 400 mg tablet   1 tablet every day by oral route.   halobetasol 0.05 % ointment Commonly known as: Ultravate Apply topically 2 (two) times daily.   montelukast 10 MG tablet Commonly known as: SINGULAIR TAKE 1 TABLET BY MOUTH EVERYDAY AT BEDTIME   PATADAY OP Apply to eye as needed.   STOOL SOFTENER PO Take by mouth.   Womens Multivitamin Tabs    Past Medical History:  Diagnosis Date   Allergy     Past Surgical History:  Procedure Laterality Date   CHALAZION EXCISION     WISDOM TOOTH EXTRACTION      Review of systems negative except as noted in HPI / PMHx or noted below:  Review of Systems  Constitutional: Negative.   HENT: Negative.    Eyes: Negative.   Respiratory: Negative.  Cardiovascular: Negative.   Gastrointestinal: Negative.   Genitourinary: Negative.   Musculoskeletal: Negative.   Skin: Negative.   Neurological: Negative.   Endo/Heme/Allergies: Negative.   Psychiatric/Behavioral: Negative.       Objective:   Vitals:   07/17/23 1116  BP: 120/78  Pulse: 76  Resp: 16  Temp: 98.1 F (36.7 C)  SpO2: 98%   Height: 5\' 7"  (170.2 cm)  Weight: 194 lb (88 kg)   Physical Exam Constitutional:      Appearance: She is not diaphoretic.  HENT:     Head: Normocephalic.     Right Ear: Tympanic membrane, ear canal and external ear normal.     Left Ear: Tympanic membrane, ear canal and external ear normal.     Nose: Nose normal. No  mucosal edema or rhinorrhea.     Mouth/Throat:     Pharynx: Uvula midline. No oropharyngeal exudate.  Eyes:     Conjunctiva/sclera: Conjunctivae normal.     Comments: Left lateral periorbital scale and erythema  Neck:     Thyroid: No thyromegaly.     Trachea: Trachea normal. No tracheal tenderness or tracheal deviation.  Cardiovascular:     Rate and Rhythm: Normal rate and regular rhythm.     Heart sounds: Normal heart sounds, S1 normal and S2 normal. No murmur heard. Pulmonary:     Effort: No respiratory distress.     Breath sounds: Normal breath sounds. No stridor. No wheezing or rales.  Lymphadenopathy:     Head:     Right side of head: No tonsillar adenopathy.     Left side of head: No tonsillar adenopathy.     Cervical: No cervical adenopathy.  Skin:    Findings: No erythema or rash.     Nails: There is no clubbing.  Neurological:     Mental Status: She is alert.     Diagnostics: none  Assessment and Plan:   1. Perennial allergic rhinitis   2. Inflammatory dermatosis    1.  Treat and prevent inflammation of airway:  A. Montelukast 10 mg - 1 tablet 1 time per day B. OTC Flonase Sensimist - 1-2 sprays each nostril 1 time per day  2. If needed:  A. Ultravate ointment to bug bite and plant dermatitis 2 times a day B. Zyrtec 10 mg - 1 tablet 1 time per day C. Pataday - 1 drop each eye 1 time per day  3. For periorbital inflammation:  A. Elidel, then Desonide ointment 3-7 times per week to affected area  4. Return to clinic in 12 months or earlier if problem  5. Influenza = Tamiflu. Covid = Paxlovid  [name] is going to use her montelukast and occasional nasal steroid to deal with her airway issue and she has a collection of items that she can utilize to deal with her skin issue.  Hopefully a combination of calcineurin inhibitor and low potency topical steroid will take care of her periorbital region.  She will keep in contact me noting her response to this  approach.  If she does well I will see her back in this clinic in 12 months or earlier if there is a problem.  [name], MD Allergy / Immunology [facility]

## 2023-07-17 NOTE — Patient Instructions (Addendum)
  1.  Treat and prevent inflammation of airway:  A. Montelukast 10 mg - 1 tablet 1 time per day B. OTC Flonase Sensimist - 1-2 sprays each nostril 1 time per day  2. If needed:  A. Ultravate ointment to bug bite and plant dermatitis 2 times a day B. Zyrtec 10 mg - 1 tablet 1 time per day C. Pataday - 1 drop each eye 1 time per day  3. For periorbital inflammation:  A. Elidel, then Desonide ointment 3-7 times per week to affected area  4. Return to clinic in 12 months or earlier if problem  5. Influenza = Tamiflu. Covid = Paxlovid

## 2023-07-18 ENCOUNTER — Encounter: Payer: Self-pay | Admitting: Allergy and Immunology

## 2024-07-16 ENCOUNTER — Ambulatory Visit: Admitting: Allergy and Immunology
